# Patient Record
Sex: Male | Born: 1985 | Hispanic: Yes | Marital: Single | Smoking: Current some day smoker
Health system: Southern US, Community
[De-identification: ages and names within clinical notes are randomized; demographics above are authoritative.]

---

## 2021-10-15 ENCOUNTER — Emergency Department: Payer: Self-pay

## 2021-10-15 ENCOUNTER — Other Ambulatory Visit: Payer: Self-pay

## 2021-10-15 ENCOUNTER — Emergency Department
Admission: EM | Admit: 2021-10-15 | Discharge: 2021-10-15 | Disposition: A | Discharge status: Home / Self Care | Payer: Self-pay | Attending: Emergency Medicine | Admitting: Emergency Medicine

## 2021-10-15 DIAGNOSIS — R1031 Right lower quadrant pain: Secondary | ICD-10-CM | POA: Insufficient documentation

## 2021-10-15 DIAGNOSIS — R110 Nausea: Secondary | ICD-10-CM | POA: Insufficient documentation

## 2021-10-15 LAB — URINALYSIS, ROUTINE W REFLEX MICROSCOPIC
Bacteria, UA: NONE SEEN
Bilirubin Urine: NEGATIVE
Glucose, UA: NEGATIVE mg/dL
Ketones, ur: NEGATIVE mg/dL
Leukocytes,Ua: NEGATIVE
Nitrite: NEGATIVE
Protein, ur: NEGATIVE mg/dL
Specific Gravity, Urine: 1.019 (ref 1.005–1.030)
Squamous Epithelial / HPF: NONE SEEN (ref 0–5)
pH: 5 (ref 5.0–8.0)

## 2021-10-15 LAB — COMPREHENSIVE METABOLIC PANEL
ALT: 28 U/L (ref 0–44)
AST: 27 U/L (ref 15–41)
Albumin: 4.4 g/dL (ref 3.5–5.0)
Alkaline Phosphatase: 68 U/L (ref 38–126)
Anion gap: 6 (ref 5–15)
BUN: 17 mg/dL (ref 6–20)
CO2: 24 mmol/L (ref 22–32)
Calcium: 9.3 mg/dL (ref 8.9–10.3)
Chloride: 105 mmol/L (ref 98–111)
Creatinine, Ser: 0.96 mg/dL (ref 0.61–1.24)
GFR, Estimated: >60 mL/min (ref >60)
Glucose, Bld: 105 mg/dL — ABNORMAL HIGH (ref 70–99)
Potassium: 4.2 mmol/L (ref 3.5–5.1)
Sodium: 135 mmol/L (ref 135–145)
Total Bilirubin: 1.1 mg/dL (ref 0.3–1.2)
Total Protein: 7.5 g/dL (ref 6.5–8.1)

## 2021-10-15 LAB — CBC
HCT: 43.1 % (ref 39.0–52.0)
Hemoglobin: 14.9 g/dL (ref 13.0–17.0)
MCH: 29.4 pg (ref 26.0–34.0)
MCHC: 34.6 g/dL (ref 30.0–36.0)
MCV: 85 fL (ref 80.0–100.0)
Platelets: 275 10*3/uL (ref 150–400)
RBC: 5.07 MIL/uL (ref 4.22–5.81)
RDW: 15.9 % — ABNORMAL HIGH (ref 11.5–15.5)
WBC: 8.3 10*3/uL (ref 4.0–10.5)
nRBC: 0 % (ref 0.0–0.2)

## 2021-10-15 LAB — LIPASE, BLOOD: Lipase: 36 U/L (ref 11–51)

## 2021-10-15 MED ORDER — HYDROCODONE-ACETAMINOPHEN 5-325 MG PO TABS: 1.0000 | ORAL_TABLET | ORAL | 0 refills | Status: AC | PRN

## 2021-10-15 NOTE — ED Triage Notes (Addendum)
Pt comes with c/o left sided abdominal pain. Pt states this started 4 days ago. Pt state tender with touch. Pt states no vomiting and diarrhea.  Pt states hx of this. Pt was not evaluated for it.  Pt states this happened after he was working and may have strained too much.

## 2021-10-15 NOTE — ED Provider Notes (Addendum)
[facility] Provider Note    Event Date/Time   First MD Initiated Contact with Patient 10/15/21 1016     (approximate)   History   Abdominal Pain   HPI  Thomas Maynard is a 36 y.o. male with no stated past medical history presents for right lower quadrant abdominal pain has been present for the last 3 days and has been worsening since onset.  Patient endorses waxing and waning of this pain however it has overall been worsening.  Patient endorses associated mild nausea without any vomiting or diarrhea.  Patient denies any fever/chills.  Patient denies any constipation, chest pain, shortness of breath, recent travel, or recent sick contacts.  Patient denies any food out of the ordinary or new medications     Physical Exam   Triage Vital Signs: ED Triage Vitals [10/15/21 1008]  Enc Vitals Group     BP 114/77     Pulse Rate 76     Resp 18     Temp 98.1 F (36.7 C)     Temp Source Oral     SpO2 97 %     Weight      Height      Head Circumference      Peak Flow      Pain Score      Pain Loc      Pain Edu?      Excl. in [place]?     Most recent vital signs: Vitals:   10/15/21 1008  BP: 114/77  Pulse: 76  Resp: 18  Temp: 98.1 F (36.7 C)  SpO2: 97%    General: Awake, no distress.  CV:  Good peripheral perfusion.  Resp:  Normal effort.  Abd:  No distention.  Mild tenderness to palpation in the right lower quadrant Other:  Middle-age Hispanic male sitting in bed in no distress   ED Results / Procedures / Treatments   Labs (all labs ordered are listed, but only abnormal results are displayed) Labs Reviewed  COMPREHENSIVE METABOLIC PANEL - Abnormal; Notable for the following components:      Result Value   Glucose, Bld 105 (*)    All other components within normal limits  CBC - Abnormal; Notable for the following components:   RDW 15.9 (*)    All other components within normal limits  URINALYSIS, ROUTINE W REFLEX MICROSCOPIC -  Abnormal; Notable for the following components:   Color, Urine YELLOW (*)    APPearance CLEAR (*)    Hgb urine dipstick SMALL (*)    All other components within normal limits  LIPASE, BLOOD   RADIOLOGY ED MD interpretation: CT without contrast of the abdomen and pelvis shows left lower quadrant inflammation with central fat density anterior to the sigmoid colon consistent with likely epiploic appendagitis  Official radiology report(s): CT Renal Stone Study  Result Date: 10/15/2021 CLINICAL DATA:  Left-sided abdominal pain x4 days. EXAM: CT ABDOMEN AND PELVIS WITHOUT CONTRAST TECHNIQUE: Multidetector CT imaging of the abdomen and pelvis was performed following the standard protocol without IV contrast. RADIATION DOSE REDUCTION: This exam was performed according to the departmental dose-optimization program which includes automated exposure control, adjustment of the mA and/or kV according to patient size and/or use of iterative reconstruction technique. COMPARISON:  None. FINDINGS: Lower chest: No acute abnormality. Hepatobiliary: Unremarkable noncontrast appearance of the hepatic parenchyma. Gallbladder is unremarkable. No biliary ductal dilation. Pancreas: No pancreatic ductal dilation or evidence of acute inflammation. Spleen: Normal in size without  focal abnormality. Adrenals/Urinary Tract: Bilateral adrenal glands are unremarkable. No hydronephrosis. No renal, ureteral or bladder calculi identified. No contour deforming renal mass. Urinary bladder is unremarkable for degree of distension. Stomach/Bowel: No enteric contrast was administered. Stomach is unremarkable for degree of distension. No pathologic dilation of small or large bowel. The appendix and terminal ileum appear normal. No suspicious colonic wall thickening or mass like lesions. Vascular/Lymphatic: No significant vascular findings are present. No enlarged abdominal or pelvic lymph nodes. Reproductive: Prostate is unremarkable. Other:  Left lower quadrant inflammation with central fat density anterior to the sigmoid colon for instance on image 60/2 without underlying colonic inflammation or diverticular disease, consistent with benign self-limiting epiploic appendagitis. Musculoskeletal: No acute or significant osseous findings. IMPRESSION: 1. Left lower quadrant inflammation with central fat density anterior to the sigmoid colon without underlying colonic inflammation or diverticular disease, consistent with epiploic appendagitis, which is a benign self-limiting condition which requires no surgical intervention or imaging follow-up but can be a cause of abdominal pain. 2. Otherwise, no acute abdominopelvic findings. Electronically Signed   By: Maudry MayhewJeffrey  Waltz M.D.   On: 10/15/2021 11:13      PROCEDURES:  Critical Care performed: No  .1-3 Lead EKG Interpretation Performed by: Merwyn KatosBradler, Amisha Pospisil K, MD Authorized by: Merwyn KatosBradler, Isak Sotomayor K, MD     Interpretation: normal     ECG rate:  75   ECG rate assessment: normal     Rhythm: sinus rhythm     Ectopy: none     Conduction: normal     MEDICATIONS ORDERED IN ED: Medications - No data to display   IMPRESSION / MDM / ASSESSMENT AND PLAN / ED COURSE  I reviewed the triage vital signs and the nursing notes.                              Differential diagnosis includes, but is not limited to, appendicitis, perforated bowel, small bowel obstruction, constipation, infectious diarrhea  The patient is on the cardiac monitor to evaluate for evidence of arrhythmia and/or significant heart rate changes.  Patient presents for abdominal pain.  Differential diagnosis includes appendicitis, abdominal aortic aneurysm, surgical biliary disease, pancreatitis, SBO, mesenteric ischemia, serious intra-abdominal bacterial illness, genital torsion. Doubt atypical ACS. Based on history, physical exam, radiologic/laboratory evaluation, there is no red flag results or symptomatology requiring emergent  intervention or need for admission at this time Pt tolerating PO. CBC shows no evidence of leukocytosis UA does show microscopic hematuria and presented this to patient for follow-up CT scan showing likely epiploic appendagitis as a cause for this patient's abdominal pain. Disposition: Patient will be discharged with strict return precautions and follow up with primary MD within 12-24 hours for further evaluation. Patient understands that this still may have an early presentation of an emergent medical condition such as appendicitis that will require a recheck.     FINAL CLINICAL IMPRESSION(S) / ED DIAGNOSES   Final diagnoses:  Right lower quadrant abdominal pain     Rx / DC Orders   ED Discharge Orders          Ordered    HYDROcodone-acetaminophen (NORCO) 5-325 MG tablet  Every 4 hours PRN        10/15/21 1145             Note:  This document was prepared using Dragon voice recognition software and may include unintentional dictation errors.   Merwyn KatosBradler, Quinzell Malcomb K, MD 10/15/21 1147  Naaman Plummer, MD 10/15/21 [phone]

## 2023-01-21 IMAGING — CT CT RENAL STONE PROTOCOL
3 of 4 series · 8 of 46 positions shown, 15 images · non-contrast
Comparison: None.

CLINICAL DATA: Left-sided abdominal pain x4 days.



[Series 4: coronal · coronal · 0.78mm/px · 3 of 145 slices shown, 4 images]
[im 49/145  soft-tissue]
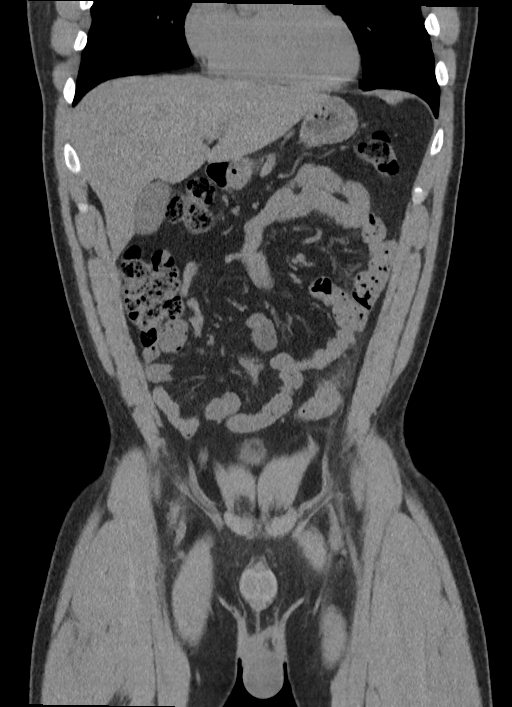
[im 65/145  soft-tissue]
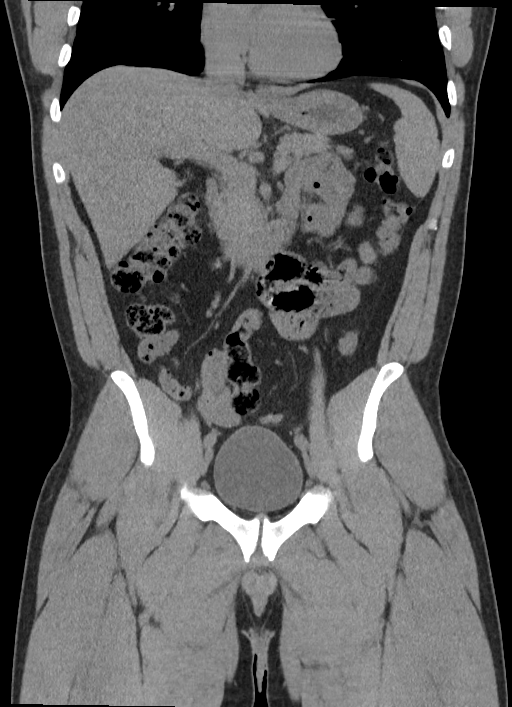
[im 65/145  bone]
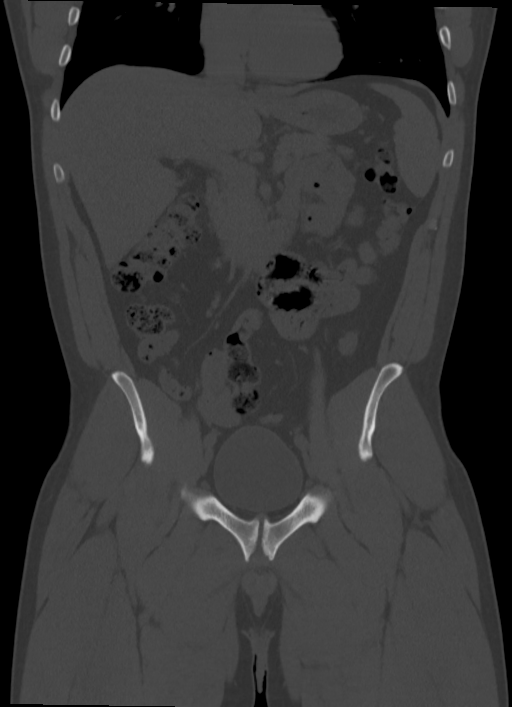
[im 81/145  soft-tissue]
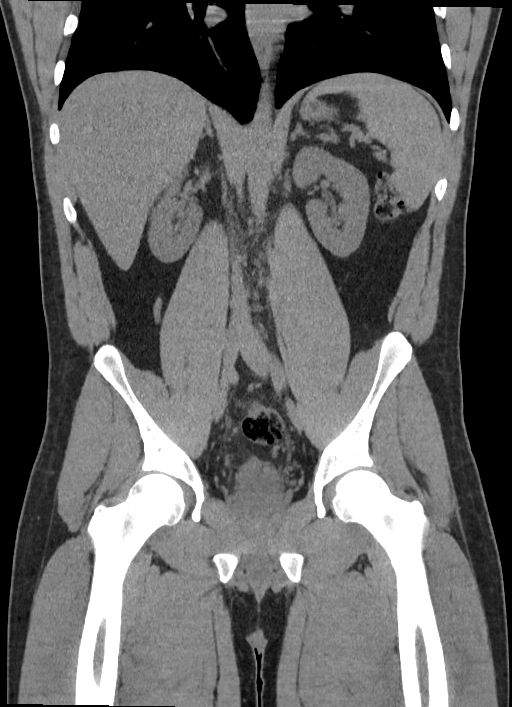

[Series 5: sagittal · sagittal · 0.56mm/px · 1 of 176 slices shown, 2 images]
[im 59/176  soft-tissue]
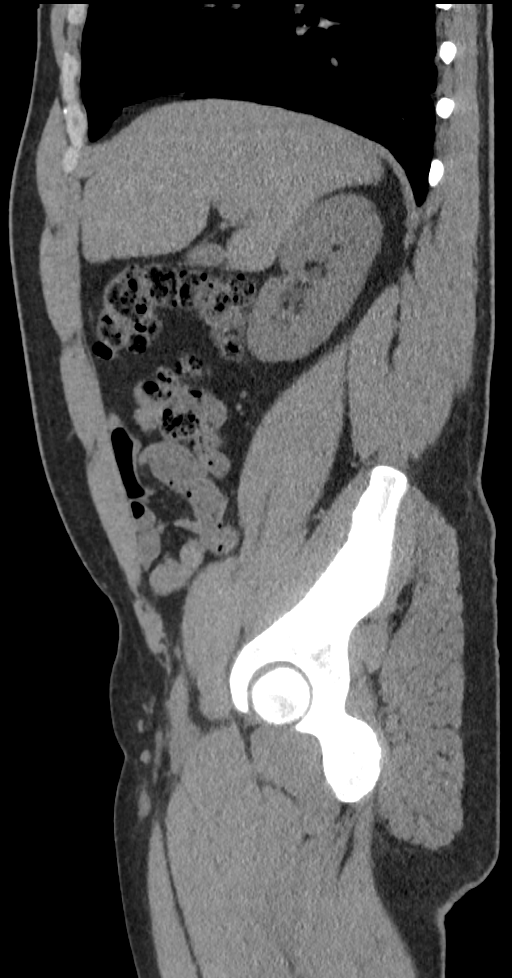
[im 59/176  bone]
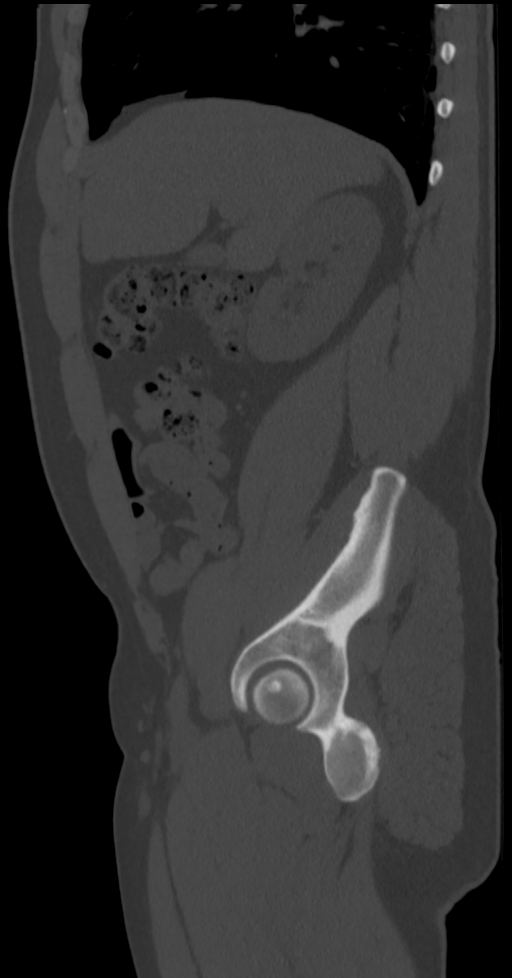

[Series 6: lung bases · axial · 0.68mm/px · z∈[-496,-421]mm · 4 of 26 slices shown, 9 images]
[im 6/26  soft-tissue]
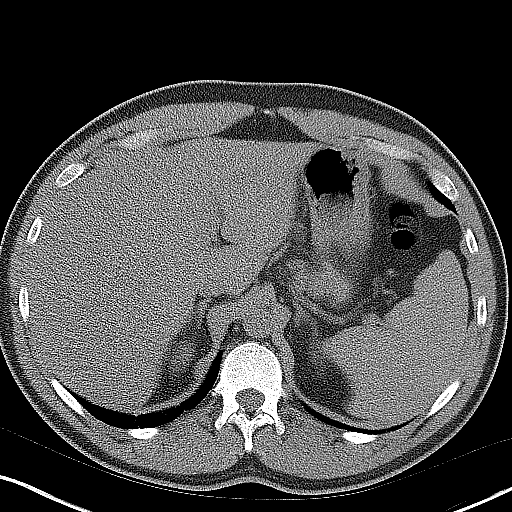
[im 6/26  lung]
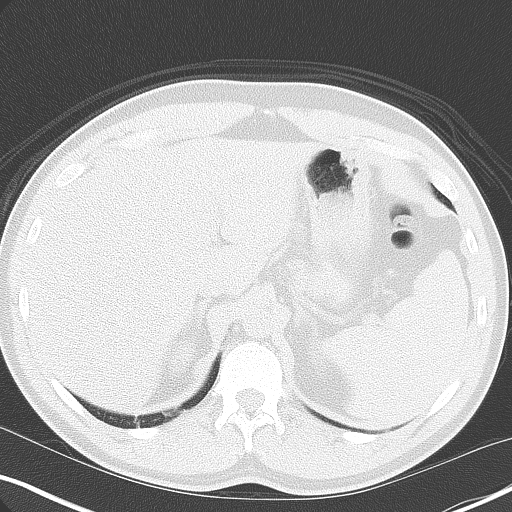
[im 6/26  bone]
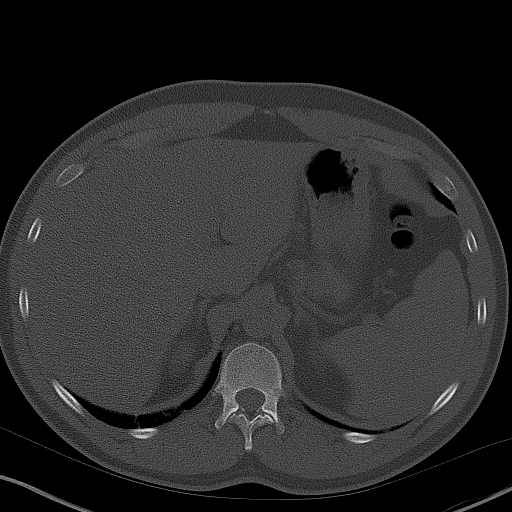
[im 11/26  soft-tissue]
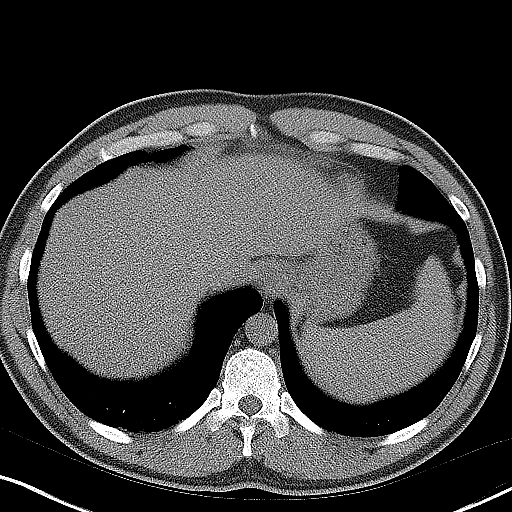
[im 11/26  lung]
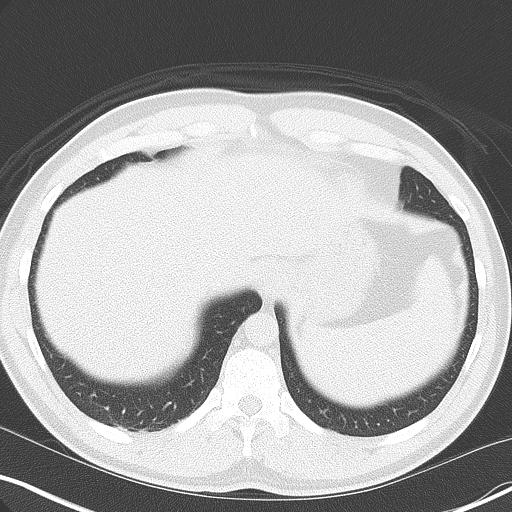
[im 16/26  soft-tissue]
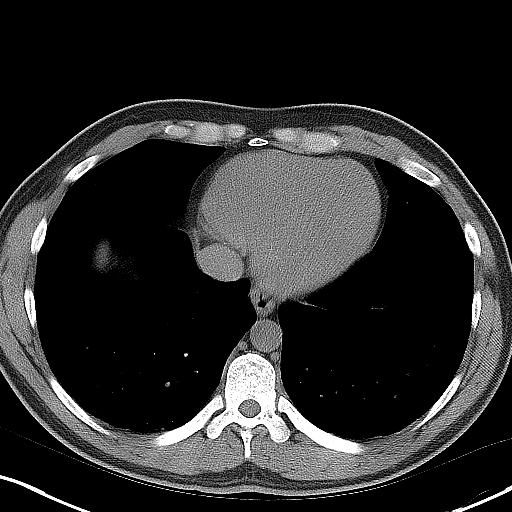
[im 16/26  lung]
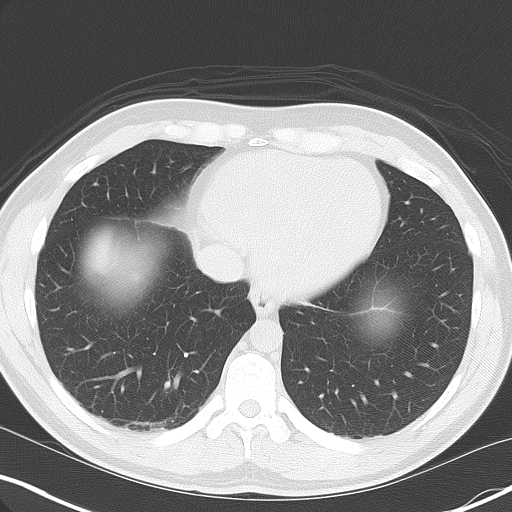
[im 21/26  soft-tissue]
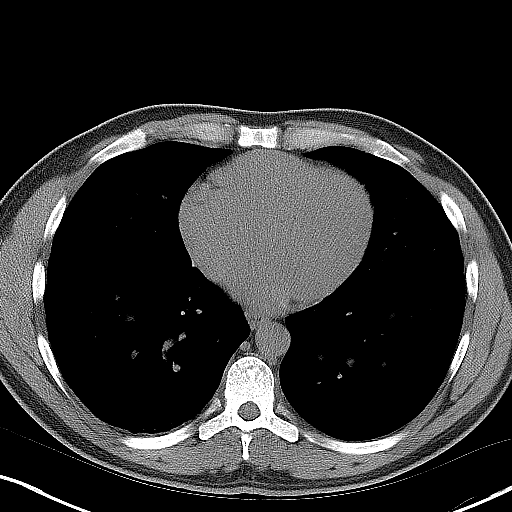
[im 21/26  lung]
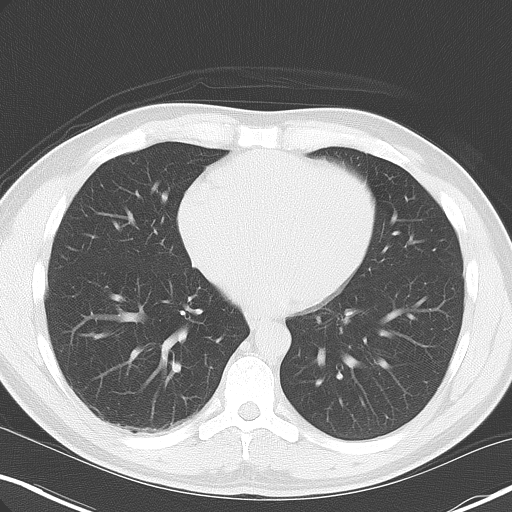

[8 of 46 positions shown; findings below may reference images not displayed]

FINDINGS: Lower chest: No acute abnormality.

Hepatobiliary: Unremarkable noncontrast appearance of the hepatic
parenchyma. Gallbladder is unremarkable. No biliary ductal dilation.

Pancreas: No pancreatic ductal dilation or evidence of acute
inflammation.

Spleen: Normal in size without focal abnormality.

Adrenals/Urinary Tract: Bilateral adrenal glands are unremarkable.
No hydronephrosis. No renal, ureteral or bladder calculi identified.
No contour deforming renal mass. Urinary bladder is unremarkable for
degree of distension.

Stomach/Bowel: No enteric contrast was administered. Stomach is
unremarkable for degree of distension. No pathologic dilation of
small or large bowel. The appendix and terminal ileum appear normal.
No suspicious colonic wall thickening or mass like lesions.

Vascular/Lymphatic: No significant vascular findings are present. No
enlarged abdominal or pelvic lymph nodes.

Reproductive: Prostate is unremarkable.

Other: Left lower quadrant inflammation with central fat density
anterior to the sigmoid colon for instance on image 60/2 without
underlying colonic inflammation or diverticular disease, consistent
with benign self-limiting epiploic appendagitis.

Musculoskeletal: No acute or significant osseous findings.
IMPRESSION: 1. Left lower quadrant inflammation with central fat density
anterior to the sigmoid colon without underlying colonic
inflammation or diverticular disease, consistent with epiploic
appendagitis, which is a benign self-limiting condition which
requires no surgical intervention or imaging follow-up but can be a
cause of abdominal pain.
2. Otherwise, no acute abdominopelvic findings.
